# Patient Record
Sex: Female | Born: 1985 | Race: White | Hispanic: No | Marital: Single | Smoking: Never smoker
Health system: Southern US, Community
[De-identification: ages and names within clinical notes are randomized; demographics above are authoritative.]

## PROBLEM LIST (undated history)

## (undated) DIAGNOSIS — E282 Polycystic ovarian syndrome: Secondary | ICD-10-CM

## (undated) DIAGNOSIS — N200 Calculus of kidney: Secondary | ICD-10-CM

## (undated) HISTORY — PX: MOUTH SURGERY: SHX715

## (undated) HISTORY — PX: WISDOM TOOTH EXTRACTION: SHX21

---

## 2010-09-10 ENCOUNTER — Ambulatory Visit: Payer: Self-pay | Admitting: Internal Medicine

## 2013-04-20 ENCOUNTER — Ambulatory Visit: Payer: Self-pay

## 2013-10-10 ENCOUNTER — Emergency Department: Payer: Self-pay | Admitting: Emergency Medicine

## 2013-10-19 ENCOUNTER — Ambulatory Visit: Payer: Self-pay | Admitting: Physician Assistant

## 2013-10-19 LAB — RAPID INFLUENZA A&B ANTIGENS (ARMC ONLY)

## 2013-10-19 LAB — RAPID STREP-A WITH REFLX: MICRO TEXT REPORT: NEGATIVE

## 2013-10-22 LAB — BETA STREP CULTURE(ARMC)

## 2014-03-01 ENCOUNTER — Emergency Department: Payer: Self-pay | Admitting: Emergency Medicine

## 2018-01-11 ENCOUNTER — Encounter: Payer: Self-pay | Admitting: Emergency Medicine

## 2018-01-11 ENCOUNTER — Other Ambulatory Visit: Payer: Self-pay

## 2018-01-11 ENCOUNTER — Emergency Department
Admission: EM | Admit: 2018-01-11 | Discharge: 2018-01-11 | Disposition: A | Discharge status: Home / Self Care | Payer: BC Managed Care – PPO | Attending: Emergency Medicine | Admitting: Emergency Medicine

## 2018-01-11 ENCOUNTER — Emergency Department: Payer: BC Managed Care – PPO

## 2018-01-11 DIAGNOSIS — S5012XA Contusion of left forearm, initial encounter: Secondary | ICD-10-CM | POA: Diagnosis not present

## 2018-01-11 DIAGNOSIS — Y999 Unspecified external cause status: Secondary | ICD-10-CM | POA: Diagnosis not present

## 2018-01-11 DIAGNOSIS — S71112A Laceration without foreign body, left thigh, initial encounter: Secondary | ICD-10-CM | POA: Diagnosis not present

## 2018-01-11 DIAGNOSIS — Y9389 Activity, other specified: Secondary | ICD-10-CM | POA: Diagnosis not present

## 2018-01-11 DIAGNOSIS — S60212A Contusion of left wrist, initial encounter: Secondary | ICD-10-CM | POA: Diagnosis not present

## 2018-01-11 DIAGNOSIS — Y92410 Unspecified street and highway as the place of occurrence of the external cause: Secondary | ICD-10-CM | POA: Insufficient documentation

## 2018-01-11 DIAGNOSIS — S59812A Other specified injuries left forearm, initial encounter: Secondary | ICD-10-CM | POA: Diagnosis present

## 2018-01-11 HISTORY — DX: Calculus of kidney: N20.0

## 2018-01-11 HISTORY — DX: Polycystic ovarian syndrome: E28.2

## 2018-01-11 LAB — LIPASE, BLOOD: Lipase: 27 U/L (ref 11–51)

## 2018-01-11 LAB — COMPREHENSIVE METABOLIC PANEL
ALK PHOS: 48 U/L (ref 38–126)
ALT: 17 U/L (ref 14–54)
ANION GAP: 11 (ref 5–15)
AST: 26 U/L (ref 15–41)
Albumin: 4.6 g/dL (ref 3.5–5.0)
BILIRUBIN TOTAL: 0.9 mg/dL (ref 0.3–1.2)
BUN: 11 mg/dL (ref 6–20)
CALCIUM: 9.5 mg/dL (ref 8.9–10.3)
CO2: 26 mmol/L (ref 22–32)
Chloride: 104 mmol/L (ref 101–111)
Creatinine, Ser: 0.85 mg/dL (ref 0.44–1.00)
GFR calc Af Amer: >60 mL/min (ref >60)
GFR calc non Af Amer: >60 mL/min (ref >60)
Glucose, Bld: 95 mg/dL (ref 65–99)
Potassium: 3.9 mmol/L (ref 3.5–5.1)
Sodium: 141 mmol/L (ref 135–145)
TOTAL PROTEIN: 8 g/dL (ref 6.5–8.1)

## 2018-01-11 LAB — CBC
HEMATOCRIT: 40.3 % (ref 35.0–47.0)
HEMOGLOBIN: 13.3 g/dL (ref 12.0–16.0)
MCH: 29.8 pg (ref 26.0–34.0)
MCHC: 33.1 g/dL (ref 32.0–36.0)
MCV: 90.2 fL (ref 80.0–100.0)
Platelets: 340 10*3/uL (ref 150–440)
RBC: 4.47 MIL/uL (ref 3.80–5.20)
RDW: 12.7 % (ref 11.5–14.5)
WBC: 11.9 10*3/uL — ABNORMAL HIGH (ref 3.6–11.0)

## 2018-01-11 MED ORDER — IBUPROFEN 800 MG PO TABS
ORAL_TABLET | ORAL | Status: AC
  Administered 2018-01-11: 600 mg via ORAL
  Filled 2018-01-11: qty 1

## 2018-01-11 MED ORDER — OXYCODONE-ACETAMINOPHEN 5-325 MG PO TABS
ORAL_TABLET | ORAL | Status: AC
  Administered 2018-01-11: 1 via ORAL
  Filled 2018-01-11: qty 1

## 2018-01-11 MED ORDER — OXYCODONE-ACETAMINOPHEN 5-325 MG PO TABS
1.0000 | ORAL_TABLET | Freq: Once | ORAL | Status: AC
  Administered 2018-01-11: 1 via ORAL

## 2018-01-11 MED ORDER — IBUPROFEN 400 MG PO TABS
600.0000 mg | ORAL_TABLET | Freq: Once | ORAL | Status: AC
  Administered 2018-01-11: 600 mg via ORAL
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
